# Patient Record
Sex: Female | Born: 2000 | Race: Black or African American | Hispanic: No | Marital: Single | Smoking: Never smoker
Health system: Southern US, Community
[De-identification: ages and names within clinical notes are randomized; demographics above are authoritative.]

## PROBLEM LIST (undated history)

## (undated) DIAGNOSIS — Z862 Personal history of diseases of the blood and blood-forming organs and certain disorders involving the immune mechanism: Secondary | ICD-10-CM

## (undated) DIAGNOSIS — D6800 Von Willebrand disease, unspecified: Secondary | ICD-10-CM

## (undated) DIAGNOSIS — Z3009 Encounter for other general counseling and advice on contraception: Secondary | ICD-10-CM

## (undated) DIAGNOSIS — K649 Unspecified hemorrhoids: Secondary | ICD-10-CM

## (undated) DIAGNOSIS — D649 Anemia, unspecified: Secondary | ICD-10-CM

## (undated) DIAGNOSIS — Z8719 Personal history of other diseases of the digestive system: Secondary | ICD-10-CM

## (undated) HISTORY — DX: Personal history of other diseases of the digestive system: Z87.19

## (undated) HISTORY — DX: Encounter for other general counseling and advice on contraception: Z30.09

## (undated) HISTORY — DX: Von Willebrand disease, unspecified: D68.00

## (undated) HISTORY — DX: Unspecified hemorrhoids: K64.9

## (undated) HISTORY — DX: Anemia, unspecified: D64.9

## (undated) HISTORY — DX: Von Willebrand's disease: D68.0

## (undated) HISTORY — DX: Personal history of diseases of the blood and blood-forming organs and certain disorders involving the immune mechanism: Z86.2

---
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
## 2019-01-26 ENCOUNTER — Ambulatory Visit (HOSPITAL_COMMUNITY): Payer: Self-pay | Admitting: Psychiatry

## 2019-01-26 ENCOUNTER — Ambulatory Visit (HOSPITAL_COMMUNITY): Payer: Medicaid Other | Admitting: Psychiatry

## 2019-01-26 ENCOUNTER — Telehealth (HOSPITAL_COMMUNITY): Payer: Self-pay | Admitting: Psychiatry

## 2019-01-26 ENCOUNTER — Other Ambulatory Visit: Payer: Self-pay

## 2019-01-26 NOTE — Telephone Encounter (Signed)
Therapist attempted to contact patient twice via text through doxy [email or web address] platform, no response.  Therapist then tried to contact patient via mobile phone and was informed patient was not available.

## 2019-02-11 ENCOUNTER — Encounter: Payer: Self-pay | Admitting: Adult Health

## 2019-02-11 ENCOUNTER — Other Ambulatory Visit: Payer: Self-pay

## 2019-02-11 ENCOUNTER — Ambulatory Visit (INDEPENDENT_AMBULATORY_CARE_PROVIDER_SITE_OTHER): Payer: Medicaid Other | Admitting: Adult Health

## 2019-02-11 VITALS — BP 114/72 | HR 95 | Ht 62.0 in | Wt 191.0 lb

## 2019-02-11 DIAGNOSIS — N926 Irregular menstruation, unspecified: Secondary | ICD-10-CM

## 2019-02-11 DIAGNOSIS — Z3202 Encounter for pregnancy test, result negative: Secondary | ICD-10-CM | POA: Diagnosis not present

## 2019-02-11 LAB — POCT URINE PREGNANCY: Preg Test, Ur: NEGATIVE

## 2019-02-11 NOTE — Progress Notes (Signed)
  Subjective:     Patient ID: Cynthia Acosta, female   DOB: 2000-05-13, 18 y.o.   MRN: [id number]  HPI Cynthia Acosta is a 18 year old black female,single, G0P0, in to discuss birth control options. She got depo in June and bleeding the whole time, does not want that any more or IUD or the pill. PCP is [facility].  Review of Systems +irregular  bleeding  Last depo in June, bleeding all the time on depo Had unprotected sex 01/31/2019 Has had nipple tenderness and fatigue and mood swings with some cramping Reviewed past medical,surgical, social and family history. Reviewed medications and allergies.     Objective:   Physical Exam BP 114/72 (BP Location: Left Arm, Patient Position: Sitting, Cuff Size: Normal)   Pulse 95   Ht 5\' 2"  (1.575 m)   Wt 191 lb (86.6 kg)   LMP 01/24/2019 (Exact Date)   BMI 34.93 kg/m UPT is negative, Skin warm and dry. Neck: mid line trachea, normal thyroid, good ROM, no lymphadenopathy noted. Lungs: clear to ausculation bilaterally. Cardiovascular: regular rate and rhythm. Fall risk is low PHQ 2 score 0.    Exam by Weyman Croon FNP student,under my supervision. Discussed the ring or the patch, or just use condoms every time, also discussed Plan B usage.   Assessment:     1. Irregular bleeding   2. Urine pregnancy test negative       Plan:     Check [facility] today, will talk tomorrow  Follow up prn Use condoms

## 2019-02-12 LAB — BETA HCG QUANT (REF LAB): hCG Quant: <1 m[IU]/mL

## 2019-03-31 ENCOUNTER — Ambulatory Visit: Payer: Medicaid Other | Admitting: Adult Health

## 2019-05-11 ENCOUNTER — Other Ambulatory Visit: Payer: Medicaid Other

## 2019-06-02 ENCOUNTER — Other Ambulatory Visit (INDEPENDENT_AMBULATORY_CARE_PROVIDER_SITE_OTHER): Payer: Medicaid Other | Admitting: *Deleted

## 2019-06-02 ENCOUNTER — Other Ambulatory Visit: Payer: Self-pay

## 2019-06-02 DIAGNOSIS — R8290 Unspecified abnormal findings in urine: Secondary | ICD-10-CM | POA: Diagnosis not present

## 2019-06-02 LAB — POCT URINALYSIS DIPSTICK
Blood, UA: NEGATIVE
Glucose, UA: NEGATIVE
Ketones, UA: NEGATIVE
Leukocytes, UA: NEGATIVE
Nitrite, UA: NEGATIVE
Protein, UA: POSITIVE — AB

## 2019-06-02 NOTE — Progress Notes (Signed)
Chart reviewed for nurse visit. Agree with plan of care.  Adline Potter, NP 06/02/2019 12:46 PM

## 2019-06-02 NOTE — Progress Notes (Signed)
   NURSE VISIT- UTI SYMPTOMS   SUBJECTIVE:  Cynthia Acosta is a 19 y.o. G0P0000 female here for UTI symptoms and STD screen. States she went to Urgent Care on Saturday for the same symptoms.  Nuswab and urinalysis was done but she sas treated with Flagyl.  All tests were negative. She still reports cloudy urine and odor.  OBJECTIVE:  There were no vitals taken for this visit.  Appears well, in no apparent distress  Results for orders placed or performed in visit on 06/02/19 (from the past 24 hour(s))  POCT Urinalysis Dipstick   Collection Time: 06/02/19 10:23 AM  Result Value Ref Range   Color, UA     Clarity, UA     Glucose, UA Negative Negative   Bilirubin, UA     Ketones, UA neg    Spec Grav, UA     Blood, UA neg    pH, UA     Protein, UA Positive (A) Negative   Urobilinogen, UA     Nitrite, UA neg    Leukocytes, UA Negative Negative   Appearance     Odor      ASSESSMENT: GYN patient with UTI symptoms and negative nitrites  PLAN: Discussed with Cyril Mourning, AGNP   Rx sent by provider today: No Urine culture sent Call or return to clinic prn if these symptoms worsen or fail to improve as anticipated. Follow-up: as needed   Jobe Marker  06/02/2019 10:28 AM

## 2019-06-03 ENCOUNTER — Telehealth: Payer: Self-pay | Admitting: *Deleted

## 2019-06-03 NOTE — Telephone Encounter (Signed)
Pt requesting visit for pelvic exam. She would like to be examined. She feels like something is not right. Reports swelling. Pt just started  New job so I told her to call back and let us know when she could make appt.

## 2019-06-05 LAB — URINE CULTURE

## 2019-06-05 LAB — SPECIMEN STATUS REPORT

## 2019-06-08 ENCOUNTER — Telehealth: Payer: Self-pay | Admitting: Adult Health

## 2019-06-08 MED ORDER — CIPROFLOXACIN HCL 500 MG PO TABS: 500.0000 mg | ORAL_TABLET | Freq: Two times a day (BID) | ORAL | 0 refills | Status: DC

## 2019-06-08 NOTE — Telephone Encounter (Signed)
Urine culture was ++. will rx cipro

## 2019-06-12 ENCOUNTER — Other Ambulatory Visit: Payer: Self-pay

## 2019-06-12 ENCOUNTER — Ambulatory Visit: Payer: Medicaid Other | Attending: Internal Medicine

## 2019-06-12 DIAGNOSIS — Z20822 Contact with and (suspected) exposure to covid-19: Secondary | ICD-10-CM

## 2019-06-13 LAB — NOVEL CORONAVIRUS, NAA: SARS-CoV-2, NAA: NOT DETECTED

## 2019-11-05 ENCOUNTER — Encounter: Payer: Self-pay | Admitting: Adult Health

## 2019-11-05 ENCOUNTER — Other Ambulatory Visit (HOSPITAL_COMMUNITY)
Admission: RE | Admit: 2019-11-05 | Discharge: 2019-11-05 | Disposition: A | Discharge status: Home / Self Care | Payer: Medicaid Other | Source: Ambulatory Visit | Attending: Adult Health | Admitting: Adult Health

## 2019-11-05 ENCOUNTER — Ambulatory Visit (INDEPENDENT_AMBULATORY_CARE_PROVIDER_SITE_OTHER): Payer: Medicaid Other | Admitting: Adult Health

## 2019-11-05 VITALS — BP 128/70 | HR 80 | Ht 62.0 in | Wt 185.0 lb

## 2019-11-05 DIAGNOSIS — N898 Other specified noninflammatory disorders of vagina: Secondary | ICD-10-CM | POA: Diagnosis not present

## 2019-11-05 DIAGNOSIS — Z113 Encounter for screening for infections with a predominantly sexual mode of transmission: Secondary | ICD-10-CM | POA: Diagnosis not present

## 2019-11-05 MED ORDER — METRONIDAZOLE 500 MG PO TABS: 500.0000 mg | ORAL_TABLET | Freq: Two times a day (BID) | ORAL | 0 refills | Status: DC

## 2019-11-05 NOTE — Progress Notes (Signed)
  Subjective:     Patient ID: Cynthia Acosta, female   DOB: [birth year], 19 y.o.   MRN: [id number]  HPI Cynthia Acosta is a 19 year old black female, single, G0P0, in complaining of vaginal discharge and odor. PCP is [facility]   Review of Systems +vaginal discharge with odor Has new partner Reviewed past medical,surgical, social and family history. Reviewed medications and allergies.     Objective:   Physical Exam BP 128/70 (BP Location: Right Arm, Patient Position: Sitting)   Pulse 80   Ht 5\' 2"  (1.575 m)   Wt 185 lb (83.9 kg)   LMP 10/24/2019   BMI 33.84 kg/m  Skin warm and dry.Pelvic: external genitalia is normal in appearance no lesions, vagina: white discharge with odor,urethra has no lesions or masses noted, cervix:nulliparous,uterus: normal size, shape and contour, non tender, no masses felt, adnexa: no masses or tenderness noted. Bladder is non tender and no masses felt. CV swab obtained.   fall risk is low PHQ 2 score is 0  [facility] - 11/05/19 1143      Pregnancy Intention Screening   Does the patient want to become pregnant in the next year? Ok Either Way    Does the patient's partner want to become pregnant in the next year? Ok Either Way    Would the patient like to discuss contraceptive options today? No      Contraception Wrap Up   Current Method No Contraceptive Precautions    End Method No Contraception Precautions    Contraception Counseling Provided No         Examination chaperoned by 11/07/19 LPN  Assessment:     1. Vaginal discharge CV swab sent   2. Vaginal odor Will Rx flagyl Meds ordered this encounter  Medications  . metroNIDAZOLE (FLAGYL) 500 MG tablet    Sig: Take 1 tablet (500 mg total) by mouth 2 (two) times daily.    Dispense:  14 tablet    Refill:  0    Order Specific Question:   Supervising Provider    Answer:   Malachy Mood, LUTHER H [[id number]]    3. Screening examination for STD (sexually transmitted disease) CV swab sent    She declines HIV and  RPR Plan:     Take PNV or folic acid Return in 10 months for pap and physical

## 2019-11-06 LAB — CERVICOVAGINAL ANCILLARY ONLY
Bacterial Vaginitis (gardnerella): POSITIVE — AB
Candida Glabrata: NEGATIVE
Candida Vaginitis: NEGATIVE
Chlamydia: NEGATIVE
Comment: NEGATIVE
Comment: NEGATIVE
Comment: NEGATIVE
Comment: NEGATIVE
Comment: NEGATIVE
Comment: NORMAL
Neisseria Gonorrhea: NEGATIVE
Trichomonas: NEGATIVE

## 2019-11-11 ENCOUNTER — Ambulatory Visit
Admission: EM | Admit: 2019-11-11 | Discharge: 2019-11-11 | Disposition: A | Discharge status: Home / Self Care | Payer: Medicaid Other | Attending: Emergency Medicine | Admitting: Emergency Medicine

## 2019-11-11 ENCOUNTER — Other Ambulatory Visit: Payer: Self-pay

## 2019-11-11 DIAGNOSIS — R110 Nausea: Secondary | ICD-10-CM | POA: Diagnosis not present

## 2019-11-11 DIAGNOSIS — R109 Unspecified abdominal pain: Secondary | ICD-10-CM | POA: Diagnosis not present

## 2019-11-11 LAB — POCT URINALYSIS DIP (MANUAL ENTRY)
Bilirubin, UA: NEGATIVE
Blood, UA: NEGATIVE
Glucose, UA: NEGATIVE mg/dL
Ketones, POC UA: NEGATIVE mg/dL
Nitrite, UA: NEGATIVE
Protein Ur, POC: NEGATIVE mg/dL
Spec Grav, UA: 1.025 (ref 1.010–1.025)
Urobilinogen, UA: 0.2 E.U./dL
pH, UA: 7 (ref 5.0–8.0)

## 2019-11-11 LAB — POCT URINE PREGNANCY: Preg Test, Ur: NEGATIVE

## 2019-11-11 MED ORDER — ONDANSETRON HCL 4 MG PO TABS: 4.0000 mg | ORAL_TABLET | Freq: Three times a day (TID) | ORAL | 0 refills | Status: DC | PRN

## 2019-11-11 NOTE — Discharge Instructions (Addendum)
POCT pregnancy test is negative POCT urine analysis show small amount of leukocyte/urine will be sent for culture and someone will call if your result is abnormal Get rest and drink fluids Zofran prescribed.  Take as directed.    DIET Instructions:  30 minutes after taking nausea medicine, begin with sips of clear liquids. If able to hold down 2 - 4 ounces for 30 minutes, begin drinking more. Increase your fluid intake to replace losses. Clear liquids only for 24 hours (water, tea, sport drinks, clear flat ginger ale or cola and juices, broth, jello, popsicles, ect). Advance to bland foods, applesauce, rice, baked or boiled chicken, ect. Avoid milk, greasy foods and anything that doesn't agree with you.  If you experience new or worsening symptoms return or go to ER such as fever, chills, nausea, vomiting, diarrhea, bloody or dark tarry stools, constipation, urinary symptoms, worsening abdominal discomfort, symptoms that do not improve with medications, inability to keep fluids down, etc..Marland Kitchen

## 2019-11-11 NOTE — ED Triage Notes (Signed)
Pt presents with c/o abdominal cramping with nausea and weakness, denies vomiting , began to day

## 2019-11-11 NOTE — ED Provider Notes (Signed)
[facility]   [id number] 11/11/19 Arrival Time: 1915  Chief Complaint  Patient presents with  . Nausea  . Abdominal Cramping     SUBJECTIVE:  Cynthia Acosta is a 19 y.o. female who presented to the urgent care for complaint of abdominal cramping, nausea and weakness started today.  Denies a precipitating event, trauma, close contacts with similar symptoms, recent travel or antibiotic use.  Localizes cramping to generalized abdomen.  Describes as intermittent.  Has not tried any OTC medication.  Denies alleviating or aggravating factors.  Reports/ denies similar symptoms in the past.  Last BM  07/28/2019.   Denies chills, fever, vomiting, chest pain, diarrhea.     Patient's last menstrual period was 10/24/2019.  ROS: As per HPI.  All other pertinent ROS negative.     Past Medical History:  Diagnosis Date  . Anemia   . Contraceptive education 11/05/2013  . Hemorrhoid 11/05/2013  . History of rectal bleeding 11/05/2013  . History of von Willebrand's disease 11/05/2013  . Von Willebrand disease (HCC)    History reviewed. No pertinent surgical history. No Known Allergies No current facility-administered medications on file prior to encounter.   Current Outpatient Medications on File Prior to Encounter  Medication Sig Dispense Refill  . metroNIDAZOLE (FLAGYL) 500 MG tablet Take 1 tablet (500 mg total) by mouth 2 (two) times daily. 14 tablet 0   Social History   Socioeconomic History  . Marital status: Single    Spouse name: Not on file  . Number of children: Not on file  . Years of education: Not on file  . Highest education level: Not on file  Occupational History  . Not on file  Tobacco Use  . Smoking status: Never Smoker  . Smokeless tobacco: Never Used  Vaping Use  . Vaping Use: Never used  Substance and Sexual Activity  . Alcohol use: No  . Drug use: No  . Sexual activity: Yes    Birth control/protection: None  Other Topics Concern  . Not on file    Social History Narrative  . Not on file   Social Determinants of Health   Financial Resource Strain:   . Difficulty of Paying Living Expenses:   Food Insecurity:   . Worried About Programme researcher, broadcasting/film/video in the Last Year:   . Barista in the Last Year:   Transportation Needs:   . Freight forwarder (Medical):   Marland Kitchen Lack of Transportation (Non-Medical):   Physical Activity:   . Days of Exercise per Week:   . Minutes of Exercise per Session:   Stress:   . Feeling of Stress :   Social Connections:   . Frequency of Communication with Friends and Family:   . Frequency of Social Gatherings with Friends and Family:   . Attends Religious Services:   . Active Member of Clubs or Organizations:   . Attends Banker Meetings:   Marland Kitchen Marital Status:   Intimate Partner Violence:   . Fear of Current or Ex-Partner:   . Emotionally Abused:   Marland Kitchen Physically Abused:   . Sexually Abused:    Family History  Problem Relation Age of Onset  . Anemia Mother   . Autism Brother      OBJECTIVE:  Vitals:   11/11/19 1941  BP: 111/78  Pulse: 74  Resp: 20  Temp: 98.6 F (37 C)  SpO2: 98%    General appearance: Alert; NAD HEENT: NCAT.  Oropharynx clear.  Lungs: clear to auscultation bilaterally without adventitious breath sounds Heart: regular rate and rhythm.  Radial pulses 2+ symmetrical bilaterally Abdomen: soft, non-distended; normal active bowel sounds; non-tender to light and deep palpation; nontender at McBurney's point; negative Murphy's sign; negative rebound; no guarding Back: no CVA tenderness Extremities: no edema; symmetrical with no gross deformities Skin: warm and dry Neurologic: normal gait Psychological: alert and cooperative; normal mood and affect  LABS: Results for orders placed or performed during the hospital encounter of 11/11/19 (from the past 24 hour(s))  POCT urinalysis dipstick     Status: Abnormal   Collection Time: 11/11/19  7:54 PM  Result  Value Ref Range   Color, UA yellow yellow   Clarity, UA clear clear   Glucose, UA negative negative mg/dL   Bilirubin, UA negative negative   Ketones, POC UA negative negative mg/dL   Spec Grav, UA 5.701 7.793 - 1.025   Blood, UA negative negative   pH, UA 7.0 5.0 - 8.0   Protein Ur, POC negative negative mg/dL   Urobilinogen, UA 0.2 0.2 or 1.0 E.U./dL   Nitrite, UA Negative Negative   Leukocytes, UA Small (1+) (A) Negative  POCT urine pregnancy     Status: None   Collection Time: 11/11/19  8:19 PM  Result Value Ref Range   Preg Test, Ur Negative Negative    DIAGNOSTIC STUDIES: No results found.   ASSESSMENT & PLAN:  1. Nausea without vomiting   2. Abdominal cramping     Meds ordered this encounter  Medications  . ondansetron (ZOFRAN) 4 MG tablet    Sig: Take 1 tablet (4 mg total) by mouth every 8 (eight) hours as needed for nausea or vomiting.    Dispense:  20 tablet    Refill:  0   Discharge instruction  POCT pregnancy test is negative POCT urine analysis show small amount of leukocyte/urine will be sent for culture and someone will call if your result is abnormal Get rest and drink fluids Zofran prescribed.  Take as directed.    DIET Instructions:  30 minutes after taking nausea medicine, begin with sips of clear liquids. If able to hold down 2 - 4 ounces for 30 minutes, begin drinking more. Increase your fluid intake to replace losses. Clear liquids only for 24 hours (water, tea, sport drinks, clear flat ginger ale or cola and juices, broth, jello, popsicles, ect). Advance to bland foods, applesauce, rice, baked or boiled chicken, ect. Avoid milk, greasy foods and anything that doesn't agree with you.  If you experience new or worsening symptoms return or go to ER such as fever, chills, nausea, vomiting, diarrhea, bloody or dark tarry stools, constipation, urinary symptoms, worsening abdominal discomfort, symptoms that do not improve with medications, inability to  keep fluids down, etc...  Reviewed expectations re: course of current medical issues. Questions answered. Outlined signs and symptoms indicating need for more acute intervention. Patient verbalized understanding. After Visit Summary given.   Durward Parcel, FNP 11/11/19 2023

## 2019-11-13 LAB — URINE CULTURE: Culture: <10000 — AB

## 2019-12-22 ENCOUNTER — Encounter: Payer: Self-pay | Admitting: Adult Health

## 2019-12-22 ENCOUNTER — Other Ambulatory Visit (HOSPITAL_COMMUNITY)
Admission: RE | Admit: 2019-12-22 | Discharge: 2019-12-22 | Disposition: A | Discharge status: Home / Self Care | Payer: Medicaid Other | Source: Ambulatory Visit | Attending: Adult Health | Admitting: Adult Health

## 2019-12-22 ENCOUNTER — Ambulatory Visit (INDEPENDENT_AMBULATORY_CARE_PROVIDER_SITE_OTHER): Payer: Medicaid Other | Admitting: Adult Health

## 2019-12-22 VITALS — BP 108/68 | HR 65 | Ht 62.0 in | Wt 183.2 lb

## 2019-12-22 DIAGNOSIS — N920 Excessive and frequent menstruation with regular cycle: Secondary | ICD-10-CM | POA: Diagnosis not present

## 2019-12-22 DIAGNOSIS — Z113 Encounter for screening for infections with a predominantly sexual mode of transmission: Secondary | ICD-10-CM

## 2019-12-22 LAB — POCT URINE PREGNANCY: Preg Test, Ur: NEGATIVE

## 2019-12-22 NOTE — Progress Notes (Signed)
  Subjective:     Patient ID: Cynthia Acosta, female   DOB: 12/15/2000, 19 y.o.   MRN: [id number]  HPI Cynthia Acosta is a 19 year old black female,single G0P0 in complaining of early and  heavy period this time, is not on any birth control. Has Von Willebrand Disease by history.  PCP is [facility].   Review of Systems This period a week early Started pink Friday, then red Saturday and heavy Sunday changed pads every hour stopped Monday and back red today Reviewed past medical,surgical, social and family history. Reviewed medications and allergies.      Objective:   Physical Exam BP 108/68 (BP Location: Left Arm, Patient Position: Sitting, Cuff Size: Normal)   Pulse 65   Ht 5\' 2"  (1.575 m)   Wt 183 lb 3.2 oz (83.1 kg)   LMP 12/19/2019 (Exact Date)   BMI 33.51 kg/m UPT is negative Skin warm and dry.Pelvic: external genitalia is normal in appearance no lesions, vagina: period blood.,urethra has no lesions or masses noted, cervix:smooth, uterus: normal size, shape and contour, non tender, no masses felt, adnexa: no masses or tenderness noted. Bladder is non tender and no masses felt. CV swab obtained.   Examination chaperoned by 02/18/2020 LPN  [facility] - 12/22/19 1515      Pregnancy Intention Screening   Does the patient want to become pregnant in the next year? No    Does the patient's partner want to become pregnant in the next year? No    Would the patient like to discuss contraceptive options today? No      Contraception Wrap Up   Current Method No Contraceptive Precautions    End Method No Contraception Precautions    Contraception Counseling Provided No           Assessment:     1. Menorrhagia with regular cycle Check CBC,CMP,TSH  She does not want to take OCs   2. Screening examination for STD (sexually transmitted disease) CV swab sent     Plan:    Follow up prn  Take folic acid 800 mcg daily or PNV

## 2019-12-23 LAB — CBC
Hematocrit: 27 % — ABNORMAL LOW (ref 34.0–46.6)
Hemoglobin: 7.7 g/dL — ABNORMAL LOW (ref 11.1–15.9)
MCH: 19.7 pg — ABNORMAL LOW (ref 26.6–33.0)
MCHC: 28.5 g/dL — ABNORMAL LOW (ref 31.5–35.7)
MCV: 69 fL — ABNORMAL LOW (ref 79–97)
Platelets: 367 10*3/uL (ref 150–450)
RBC: 3.9 x10E6/uL (ref 3.77–5.28)
RDW: 19.4 % — ABNORMAL HIGH (ref 11.7–15.4)
WBC: 3.9 10*3/uL (ref 3.4–10.8)

## 2019-12-23 LAB — COMPREHENSIVE METABOLIC PANEL
ALT: 5 IU/L (ref 0–32)
AST: 14 IU/L (ref 0–40)
Albumin/Globulin Ratio: 1.5 (ref 1.2–2.2)
Albumin: 4.4 g/dL (ref 3.9–5.0)
Alkaline Phosphatase: 49 IU/L (ref 42–106)
BUN/Creatinine Ratio: 8 — ABNORMAL LOW (ref 9–23)
BUN: 7 mg/dL (ref 6–20)
Bilirubin Total: <0.2 mg/dL (ref 0.0–1.2)
CO2: 23 mmol/L (ref 20–29)
Calcium: 8.9 mg/dL (ref 8.7–10.2)
Chloride: 105 mmol/L (ref 96–106)
Creatinine, Ser: 0.93 mg/dL (ref 0.57–1.00)
GFR calc Af Amer: 102 mL/min/{1.73_m2} (ref >59)
GFR calc non Af Amer: 89 mL/min/{1.73_m2} (ref >59)
Globulin, Total: 2.9 g/dL (ref 1.5–4.5)
Glucose: 90 mg/dL (ref 65–99)
Potassium: 4.5 mmol/L (ref 3.5–5.2)
Sodium: 139 mmol/L (ref 134–144)
Total Protein: 7.3 g/dL (ref 6.0–8.5)

## 2019-12-23 LAB — TSH: TSH: 1.11 u[IU]/mL (ref 0.450–4.500)

## 2019-12-24 LAB — CERVICOVAGINAL ANCILLARY ONLY
Chlamydia: NEGATIVE
Comment: NEGATIVE
Comment: NEGATIVE
Comment: NORMAL
Neisseria Gonorrhea: NEGATIVE
Trichomonas: NEGATIVE

## 2019-12-25 ENCOUNTER — Encounter: Payer: Self-pay | Admitting: Adult Health

## 2019-12-25 ENCOUNTER — Telehealth: Payer: Self-pay | Admitting: Adult Health

## 2019-12-25 DIAGNOSIS — D509 Iron deficiency anemia, unspecified: Secondary | ICD-10-CM

## 2019-12-25 HISTORY — DX: Iron deficiency anemia, unspecified: D50.9

## 2019-12-25 NOTE — Telephone Encounter (Signed)
Pt aware GC/CHL and trich were negative on vaginal swab, and that HGB 7.7 get back on iron and follow up in about 29-months

## 2020-02-26 IMAGING — CT CT ANGIOGRAPHY NECK
1 of 10 series · 6 of 33 positions shown · IV contrast (Isovue)
Comparison: Head CT 11/23/2010

CLINICAL DATA: Sudden onset headache. Weakness. History of Von
Willebrand's disease.

EXAM:
CT ANGIOGRAPHY HEAD AND NECK
TECHNIQUE: Multidetector CT imaging of the head and neck was performed using
the standard protocol during bolus administration of intravenous
contrast. Multiplanar CT image reconstructions and MIPs were
obtained to evaluate the vascular anatomy. Carotid stenosis
measurements (when applicable) are obtained utilizing NASCET
criteria, using the distal internal carotid diameter as the
denominator.
CONTRAST:  75mL OMNIPAQUE IOHEXOL 350 MG/ML SOLN

[Series 10: ax thin · axial · 0.39mm/px · z∈[-124,+109]mm · 6 of 327 slices shown]
[im 47/327  soft-tissue]
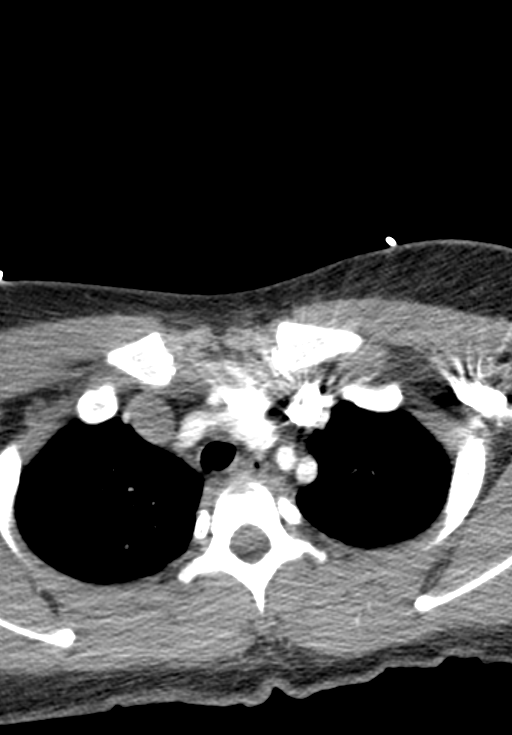
[im 94/327  bone]
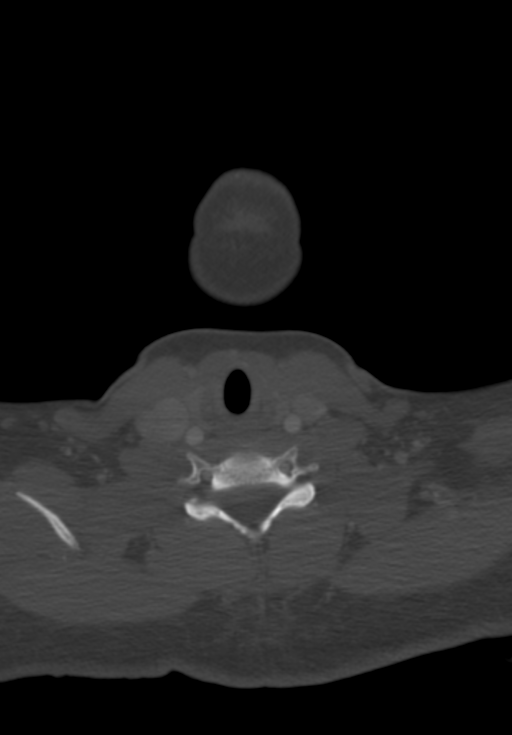
[im 140/327  soft-tissue]
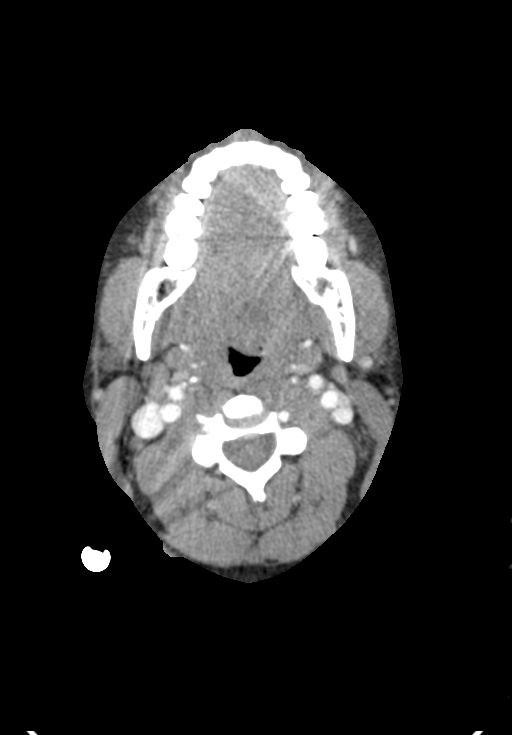
[im 187/327  bone]
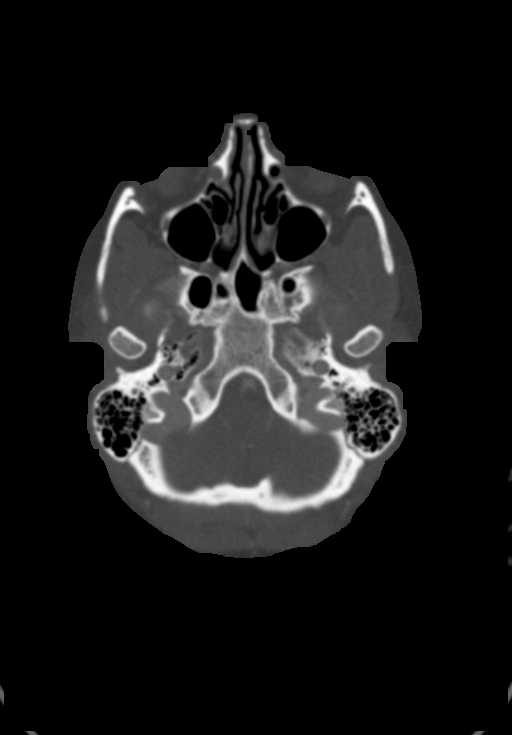
[im 233/327  soft-tissue]
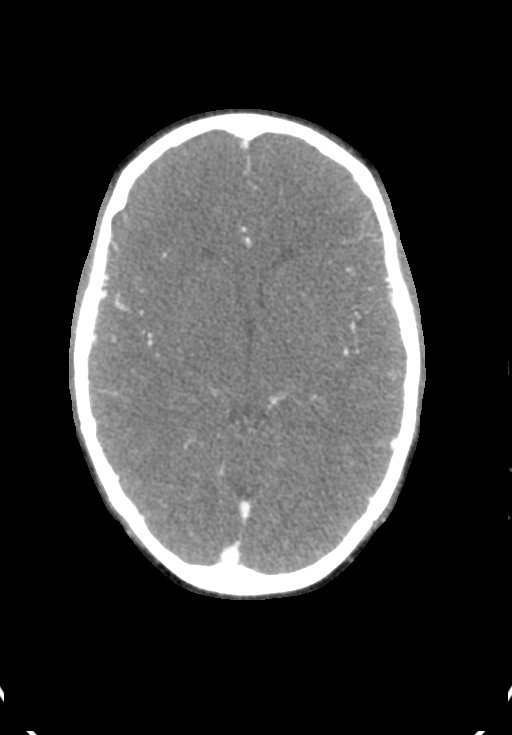
[im 280/327  bone]
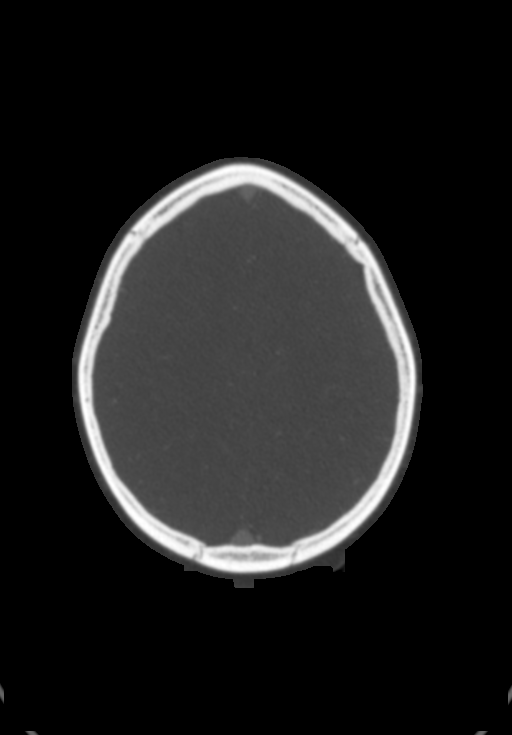

[6 of 33 positions shown; findings below may reference images not displayed]

FINDINGS: CT HEAD FINDINGS

Brain: There is no evidence of acute infarct, intracranial
hemorrhage, mass, midline shift, or extra-axial fluid collection.
The ventricles and sulci are normal.

Vascular: No hyperdense vessel.

Skull: No fracture or focal osseous lesion.

Sinuses: Paranasal sinuses and mastoid air cells are clear.

Orbits: Unremarkable.

Review of the MIP images confirms the above findings

CTA NECK FINDINGS

Aortic arch: Standard 3 vessel aortic arch with widely patent arch
vessel origins.

Right carotid system: Patent and smooth without evidence of stenosis
or dissection.

Left carotid system: Patent and smooth without evidence of stenosis
or dissection.

Vertebral arteries: Patent and smooth without evidence of stenosis
or dissection. Codominant.

Skeleton: No acute osseous abnormality or suspicious osseous lesion.

Other neck: No mass or enlarged lymph nodes.

Upper chest: Clear lung apices.

Review of the MIP images confirms the above findings

CTA HEAD FINDINGS

Anterior circulation: The internal carotid arteries are widely
patent from skull base to carotid termini. ACAs and MCAs are patent
without evidence of proximal branch occlusion or significant
proximal stenosis. No aneurysm is identified.

Posterior circulation: The intracranial vertebral arteries are
widely patent to the basilar. The basilar artery is widely patent
with a small fenestration incidentally noted proximally. Cerebellar
arteries are grossly unremarkable. Posterior communicating arteries
are not identified and may be diminutive or absent. PCAs are patent
without evidence of significant proximal stenosis. No aneurysm is
identified.

Venous sinuses: Patent.

Anatomic variants: None.

Review of the MIP images confirms the above findings
IMPRESSION: Negative head and neck CTA. Unremarkable CT appearance of the brain.

## 2020-04-09 ENCOUNTER — Encounter (HOSPITAL_COMMUNITY): Payer: Self-pay | Admitting: Emergency Medicine

## 2020-04-09 ENCOUNTER — Emergency Department (HOSPITAL_COMMUNITY)
Admission: EM | Admit: 2020-04-09 | Discharge: 2020-04-09 | Disposition: A | Discharge status: Home / Self Care | Payer: Medicaid Other | Attending: Emergency Medicine | Admitting: Emergency Medicine

## 2020-04-09 ENCOUNTER — Other Ambulatory Visit: Payer: Self-pay

## 2020-04-09 DIAGNOSIS — R519 Headache, unspecified: Secondary | ICD-10-CM | POA: Diagnosis present

## 2020-04-09 DIAGNOSIS — U071 COVID-19: Secondary | ICD-10-CM

## 2020-04-09 DIAGNOSIS — G4489 Other headache syndrome: Secondary | ICD-10-CM

## 2020-04-09 LAB — CBC WITH DIFFERENTIAL/PLATELET
Abs Immature Granulocytes: 0.01 10*3/uL (ref 0.00–0.07)
Basophils Absolute: 0 10*3/uL (ref 0.0–0.1)
Basophils Relative: 1 %
Eosinophils Absolute: 0 10*3/uL (ref 0.0–0.5)
Eosinophils Relative: 0 %
HCT: 28.4 % — ABNORMAL LOW (ref 36.0–46.0)
Hemoglobin: 8.3 g/dL — ABNORMAL LOW (ref 12.0–15.0)
Immature Granulocytes: 0 %
Lymphocytes Relative: 36 %
Lymphs Abs: 1.5 10*3/uL (ref 0.7–4.0)
MCH: 20.8 pg — ABNORMAL LOW (ref 26.0–34.0)
MCHC: 29.2 g/dL — ABNORMAL LOW (ref 30.0–36.0)
MCV: 71 fL — ABNORMAL LOW (ref 80.0–100.0)
Monocytes Absolute: 0.3 10*3/uL (ref 0.1–1.0)
Monocytes Relative: 8 %
Neutro Abs: 2.3 10*3/uL (ref 1.7–7.7)
Neutrophils Relative %: 55 %
Platelets: 264 10*3/uL (ref 150–400)
RBC: 4 MIL/uL (ref 3.87–5.11)
RDW: 18.9 % — ABNORMAL HIGH (ref 11.5–15.5)
WBC: 4.2 10*3/uL (ref 4.0–10.5)
nRBC: 0 % (ref 0.0–0.2)

## 2020-04-09 LAB — RESP PANEL BY RT-PCR (FLU A&B, COVID) ARPGX2
Influenza A by PCR: NEGATIVE
Influenza B by PCR: NEGATIVE
SARS Coronavirus 2 by RT PCR: POSITIVE — AB

## 2020-04-09 LAB — BASIC METABOLIC PANEL
Anion gap: 8 (ref 5–15)
BUN: 11 mg/dL (ref 6–20)
CO2: 24 mmol/L (ref 22–32)
Calcium: 8.8 mg/dL — ABNORMAL LOW (ref 8.9–10.3)
Chloride: 104 mmol/L (ref 98–111)
Creatinine, Ser: 0.84 mg/dL (ref 0.44–1.00)
GFR, Estimated: >60 mL/min (ref >60)
Glucose, Bld: 102 mg/dL — ABNORMAL HIGH (ref 70–99)
Potassium: 4.2 mmol/L (ref 3.5–5.1)
Sodium: 136 mmol/L (ref 135–145)

## 2020-04-09 LAB — I-STAT BETA HCG BLOOD, ED (MC, WL, AP ONLY): I-stat hCG, quantitative: <5 m[IU]/mL (ref <5)

## 2020-04-09 MED ORDER — METOCLOPRAMIDE HCL 5 MG/ML IJ SOLN
10.0000 mg | Freq: Once | INTRAMUSCULAR | Status: AC
  Administered 2020-04-09: 10 mg via INTRAVENOUS
  Filled 2020-04-09: qty 2

## 2020-04-09 MED ORDER — DIPHENHYDRAMINE HCL 50 MG/ML IJ SOLN
25.0000 mg | Freq: Once | INTRAMUSCULAR | Status: AC
  Administered 2020-04-09: 25 mg via INTRAVENOUS
  Filled 2020-04-09: qty 1

## 2020-04-09 NOTE — ED Notes (Signed)
ED Provider at bedside. 

## 2020-04-09 NOTE — ED Triage Notes (Signed)
Pt c/o headache since Thursday. States tonight it began to hurt behind her eyes. Pt states she was exposed to COVID last week but tested negative on Wednesday.

## 2020-04-09 NOTE — ED Notes (Signed)
Date and time results received: 04/09/20 0410   Test: COVID Critical Value: POSITIVE  Name of Provider Notified: Bebe Shaggy, MD

## 2020-04-09 NOTE — ED Provider Notes (Signed)
[facility] EMERGENCY DEPARTMENT Provider Note   CSN: [id number] Arrival date & time: 04/09/20  0128     History Chief Complaint  Patient presents with  . Headache    MALONIE TATUM is a 20 y.o. female.  The history is provided by the patient.  Headache Pain location:  Generalized Quality:  Dull Onset quality:  Gradual Duration:  2 days Timing:  Intermittent Progression:  Worsening Chronicity:  New Similar to prior headaches: yes   Relieved by:  Nothing Worsened by:  Nothing Associated symptoms: cough, nausea and vomiting   Associated symptoms: no blurred vision, no fever, no focal weakness, no visual change and no weakness   Patient with history of anemia, von Willebrand disease, iron deficiency anemia presents with headache.  She reports over the past 2 days she has had gradually worsening headache.  She also reports associated cough.  She has had an episode of vomiting. No focal weakness.  She does report the headache is around her eyes but denies any visual changes.  No falls or head injury. Patient reports frequent history of anemia due to heavy vaginal bleeding.  She suspects her blood counts may be low.  She also reports recent Covid exposure     Past Medical History:  Diagnosis Date  . Anemia   . Contraceptive education 11/05/2013  . Hemorrhoid 11/05/2013  . History of rectal bleeding 11/05/2013  . History of von Willebrand's disease 11/05/2013  . Iron deficiency anemia, unspecified 12/25/2019  . Von Willebrand disease [facility])     Patient Active Problem List   Diagnosis Date Noted  . Iron deficiency anemia, unspecified 12/25/2019  . Menorrhagia with regular cycle 12/22/2019  . Screening examination for STD (sexually transmitted disease) 12/22/2019  . Irregular bleeding 02/11/2019  . Urine pregnancy test negative 02/11/2019  . Hemorrhoid 11/05/2013  . History of rectal bleeding 11/05/2013  . Contraceptive education 11/05/2013  . History of von Willebrand's  disease 11/05/2013  . Urinary incontinence 03/12/2013  . Urinary urgency 03/12/2013  . Urinary frequency 03/12/2013  . Fullness of neck 03/12/2013    History reviewed. No pertinent surgical history.   OB History    Gravida  0   Para  0   Term  0   Preterm  0   AB  0   Living  0     SAB  0   IAB  0   Ectopic  0   Multiple  0   Live Births              Family History  Problem Relation Age of Onset  . Anemia Mother   . Autism Brother     Social History   Tobacco Use  . Smoking status: Never Smoker  . Smokeless tobacco: Never Used  Vaping Use  . Vaping Use: Never used  Substance Use Topics  . Alcohol use: No  . Drug use: No    Home Medications Prior to Admission medications   Not on File    Allergies    Patient has no known allergies.  Review of Systems   Review of Systems  Constitutional: Negative for fever.  Eyes: Negative for blurred vision.  Respiratory: Positive for cough.   Gastrointestinal: Positive for nausea and vomiting.  Genitourinary:       Heavy menstrual cycle  Neurological: Positive for headaches. Negative for focal weakness and weakness.  All other systems reviewed and are negative.   Physical Exam Updated Vital Signs BP 109/75 (BP  Location: Right Arm)   Pulse (!) 105   Temp 98 F (36.7 C) (Oral)   Resp 17   Ht 1.575 m (5\' 2" )   Wt 84.4 kg   SpO2 100%   BMI 34.02 kg/m   Physical Exam CONSTITUTIONAL: Well developed/well nourished HEAD: Normocephalic/atraumatic EYES: EOMI/PERRL, no nystagmus, no ptosis, conjunctiva pink ENMT: Mucous membranes moist NECK: supple no meningeal signs, no bruits SPINE/BACK:entire spine nontender CV: S1/S2 noted, no murmurs/rubs/gallops noted LUNGS: Lungs are clear to auscultation bilaterally, no apparent distress ABDOMEN: soft, nontender, no rebound or guarding GU:no cva tenderness NEURO:Awake/alert, face symmetric, no arm or leg drift is noted Equal 5/5 strength with shoulder  abduction, elbow flex/extension, wrist flex/extension in upper extremities and equal hand grips bilaterally Equal 5/5 strength with hip flexion,knee flex/extension, foot dorsi/plantar flexion Cranial nerves 3/4/5/6/10/15/08/11/12 tested and intact Gait normal without ataxia No past pointing Sensation to light touch intact in all extremities EXTREMITIES: pulses normal, full ROM SKIN: warm, color normal PSYCH: no abnormalities of mood noted, alert and oriented to situation   ED Results / Procedures / Treatments   Labs (all labs ordered are listed, but only abnormal results are displayed) Labs Reviewed  RESP PANEL BY RT-PCR (FLU A&B, COVID) ARPGX2 - Abnormal; Notable for the following components:      Result Value   SARS Coronavirus 2 by RT PCR POSITIVE (*)    All other components within normal limits  BASIC METABOLIC PANEL - Abnormal; Notable for the following components:   Glucose, Bld 102 (*)    Calcium 8.8 (*)    All other components within normal limits  CBC WITH DIFFERENTIAL/PLATELET - Abnormal; Notable for the following components:   Hemoglobin 8.3 (*)    HCT 28.4 (*)    MCV 71.0 (*)    MCH 20.8 (*)    MCHC 29.2 (*)    RDW 18.9 (*)    All other components within normal limits  I-STAT BETA HCG BLOOD, ED ([facility], [facility], [facility] ONLY)    EKG None  Radiology No results found.  Procedures Procedures   Medications Ordered in ED Medications  metoCLOPramide (REGLAN) injection 10 mg (10 mg Intravenous Given 04/09/20 0309)  diphenhydrAMINE (BENADRYL) injection 25 mg (25 mg Intravenous Given 04/09/20 0308)    ED Course  I have reviewed the triage vital signs and the nursing notes.  Pertinent labs  results that were available during my care of the patient were reviewed by me and considered in my medical decision making (see chart for details).    MDM Rules/Calculators/A&P                          2:47 AM Patient presents with headache.  She has no focal neuro deficit at this time. At  her request, we will check for COVID-19.  She also reports history of heavy vaginal bleeding in the setting of von Willebrand disease, will check labs including complete blood count to evaluate for anemia  4:27 AM Patient is improved.  No acute distress.  Anemia is actually improved from prior.  However she is positive for COVID-19.  Patient has had recent coughing.  She is otherwise in no distress, there is no hypoxia.  She is appropriate for outpatient management. We discussed return precautions.  We also discussed isolation guidelines.  BRAELEE HERRLE was evaluated in Emergency Department on 04/09/2020 for the symptoms described in the history of present illness. She was evaluated in the context of  the global COVID-19 pandemic, which necessitated consideration that the patient might be at risk for infection with the SARS-CoV-2 virus that causes COVID-19. Institutional protocols and algorithms that pertain to the evaluation of patients at risk for COVID-19 are in a state of rapid change based on information released by regulatory bodies including the CDC and federal and state organizations. These policies and algorithms were followed during the patient's care in the ED.  Final Clinical Impression(s) / ED Diagnoses Final diagnoses:  Other headache syndrome  COVID-19    Rx / DC Orders ED Discharge Orders    None       Ripley Fraise, MD 04/09/20 [phone]

## 2020-05-25 ENCOUNTER — Encounter: Payer: Self-pay | Admitting: Emergency Medicine

## 2020-05-25 ENCOUNTER — Other Ambulatory Visit: Payer: Self-pay

## 2020-05-25 ENCOUNTER — Ambulatory Visit
Admission: EM | Admit: 2020-05-25 | Discharge: 2020-05-25 | Disposition: A | Discharge status: Home / Self Care | Payer: Medicaid Other | Attending: Emergency Medicine | Admitting: Emergency Medicine

## 2020-05-25 DIAGNOSIS — R509 Fever, unspecified: Secondary | ICD-10-CM

## 2020-05-25 DIAGNOSIS — J020 Streptococcal pharyngitis: Secondary | ICD-10-CM

## 2020-05-25 DIAGNOSIS — Z20822 Contact with and (suspected) exposure to covid-19: Secondary | ICD-10-CM | POA: Diagnosis not present

## 2020-05-25 DIAGNOSIS — J029 Acute pharyngitis, unspecified: Secondary | ICD-10-CM | POA: Diagnosis not present

## 2020-05-25 LAB — POCT RAPID STREP A (OFFICE): Rapid Strep A Screen: POSITIVE — AB

## 2020-05-25 MED ORDER — LIDOCAINE VISCOUS HCL 2 % MT SOLN: 15.0000 mL | OROMUCOSAL | 0 refills | Status: AC | PRN

## 2020-05-25 MED ORDER — AMOXICILLIN-POT CLAVULANATE 875-125 MG PO TABS: 1.0000 | ORAL_TABLET | Freq: Two times a day (BID) | ORAL | 0 refills | Status: DC

## 2020-05-25 NOTE — ED Triage Notes (Signed)
States tonsils are swollen since Sunday and it hurts to talk.  Right ear pops once in a while.

## 2020-05-25 NOTE — ED Provider Notes (Signed)
[facility]   [id number] 05/25/20 Arrival Time: 0904   CC: Swollen tonsils  SUBJECTIVE: History from: patient.  Cynthia Acosta is a 20 y.o. female who presented to the urgent care for complaint of sore throat for the past 3 to 4 days.  Denies sick exposure to COVID, flu or strep.  Denies recent travel.  Has tried OTC medication without relief. Symptoms are made worse with eating. Denies previous symptoms in the past.   Denies fatigue, sinus pain, rhinorrhea, SOB, wheezing, chest pain, nausea, changes in bowel or bladder habits.     ROS: As per HPI.  All other pertinent ROS negative.      Past Medical History:  Diagnosis Date  . Anemia   . Contraceptive education 11/05/2013  . Hemorrhoid 11/05/2013  . History of rectal bleeding 11/05/2013  . History of von Willebrand's disease 11/05/2013  . Iron deficiency anemia, unspecified 12/25/2019  . Von Willebrand disease (HCC)    History reviewed. No pertinent surgical history. No Known Allergies No current facility-administered medications on file prior to encounter.   No current outpatient medications on file prior to encounter.   Social History   Socioeconomic History  . Marital status: Single    Spouse name: Not on file  . Number of children: Not on file  . Years of education: Not on file  . Highest education level: Not on file  Occupational History  . Not on file  Tobacco Use  . Smoking status: Never Smoker  . Smokeless tobacco: Never Used  Vaping Use  . Vaping Use: Never used  Substance and Sexual Activity  . Alcohol use: No  . Drug use: No  . Sexual activity: Yes    Birth control/protection: None  Other Topics Concern  . Not on file  Social History Narrative  . Not on file   Social Determinants of Health   Financial Resource Strain: Not on file  Food Insecurity: Not on file  Transportation Needs: Not on file  Physical Activity: Not on file  Stress: Not on file  Social Connections: Not on file   Intimate Partner Violence: Not on file   Family History  Problem Relation Age of Onset  . Anemia Mother   . Autism Brother     OBJECTIVE:  Vitals:   05/25/20 0928  BP: 113/68  Pulse: (!) 115  Resp: 18  Temp: 99.3 F (37.4 C)  TempSrc: Oral  SpO2: 98%     General appearance: alert; appears fatigued, but nontoxic; speaking in full sentences and tolerating own secretions HEENT: NCAT; Ears: EACs clear, TMs pearly gray; Eyes: PERRL.  EOM grossly intact. Sinuses: nontender; Nose: nares patent without rhinorrhea, Throat: oropharynx clear, tonsils  erythematous and enlarged with white tonsillar exudate, uvula midline  Neck: supple without LAD Lungs: unlabored respirations, symmetrical air entry; cough: absent; no respiratory distress; CTAB Heart: regular rate and rhythm.  Radial pulses 2+ symmetrical bilaterally Skin: warm and dry Psychological: alert and cooperative; normal mood and affect  LABS:  Results for orders placed or performed during the hospital encounter of 05/25/20 (from the past 24 hour(s))  POCT rapid strep A     Status: Abnormal   Collection Time: 05/25/20  9:55 AM  Result Value Ref Range   Rapid Strep A Screen Positive (A) Negative     ASSESSMENT & PLAN:  1. Sore throat   2. Fever, unknown origin   3. Strep pharyngitis     Meds ordered this encounter  Medications  .  amoxicillin-clavulanate (AUGMENTIN) 875-125 MG tablet    Sig: Take 1 tablet by mouth every 12 (twelve) hours.    Dispense:  14 tablet    Refill:  0  . lidocaine (XYLOCAINE) 2 % solution    Sig: Use as directed 15 mLs in the mouth or throat as needed for mouth pain.    Dispense:  100 mL    Refill:  0     Discharge Instructions  Get plenty of rest and push fluids Lidocaine mouthwash was prescribed Augmentin was prescribed for possible tonsillitis Gargle with salty warm water daily Change your toothbrush 2 days after starting the antibiotic Use medications daily for symptom  relief Use OTC medications like ibuprofen or tylenol as needed fever or pain Call or go to the ED if you have any new or worsening symptoms such as fever, worsening cough, shortness of breath, chest tightness, chest pain, turning blue, changes in mental status, etc...   Reviewed expectations re: course of current medical issues. Questions answered. Outlined signs and symptoms indicating need for more acute intervention. Patient verbalized understanding. After Visit Summary given.         Durward Parcel, FNP 05/25/20 1009

## 2020-05-25 NOTE — Discharge Instructions (Addendum)
Get plenty of rest and push fluids Lidocaine mouthwash was prescribed Augmentin was prescribed for possible tonsillitis Gargle with salty warm water daily Change your toothbrush 2 days after starting the antibiotic Use medications daily for symptom relief Use OTC medications like ibuprofen or tylenol as needed fever or pain Call or go to the ED if you have any new or worsening symptoms such as fever, worsening cough, shortness of breath, chest tightness, chest pain, turning blue, changes in mental status, etc..Marland Kitchen

## 2020-06-07 ENCOUNTER — Ambulatory Visit: Payer: Medicaid Other | Admitting: Adult Health

## 2020-06-20 ENCOUNTER — Other Ambulatory Visit: Payer: Medicaid Other

## 2020-06-28 ENCOUNTER — Ambulatory Visit
Admission: EM | Admit: 2020-06-28 | Discharge: 2020-06-28 | Disposition: A | Discharge status: Home / Self Care | Payer: Medicaid Other | Attending: Physician Assistant | Admitting: Physician Assistant

## 2020-06-28 ENCOUNTER — Other Ambulatory Visit: Payer: Self-pay

## 2020-06-28 DIAGNOSIS — J029 Acute pharyngitis, unspecified: Secondary | ICD-10-CM | POA: Insufficient documentation

## 2020-06-28 LAB — POCT RAPID STREP A (OFFICE): Rapid Strep A Screen: NEGATIVE

## 2020-06-28 NOTE — ED Triage Notes (Signed)
Pt presents with c/o sore throat and nasal congestion  For past couple of days

## 2020-06-28 NOTE — Discharge Instructions (Addendum)
Tylenol or ibuprofen every 4 hours. Your covid is pending

## 2020-06-29 LAB — SARS-COV-2, NAA 2 DAY TAT

## 2020-06-29 LAB — NOVEL CORONAVIRUS, NAA: SARS-CoV-2, NAA: NOT DETECTED

## 2020-06-30 NOTE — ED Provider Notes (Signed)
[facility]    CSN: [id number] Arrival date & time: 06/28/20  1824      History   Chief Complaint Chief Complaint  Patient presents with  . Sore Throat    HPI Cynthia Acosta is a 20 y.o. female.   The history is provided by the patient. No language interpreter was used.  Sore Throat This is a new problem. The current episode started less than 1 hour ago. The problem occurs constantly. The problem has not changed since onset.Pertinent negatives include no headaches. Nothing aggravates the symptoms. Nothing relieves the symptoms. She has tried nothing for the symptoms. The treatment provided no relief.    Past Medical History:  Diagnosis Date  . Anemia   . Contraceptive education 11/05/2013  . Hemorrhoid 11/05/2013  . History of rectal bleeding 11/05/2013  . History of von Willebrand's disease 11/05/2013  . Iron deficiency anemia, unspecified 12/25/2019  . Von Willebrand disease [facility])     Patient Active Problem List   Diagnosis Date Noted  . Iron deficiency anemia, unspecified 12/25/2019  . Menorrhagia with regular cycle 12/22/2019  . Screening examination for STD (sexually transmitted disease) 12/22/2019  . Irregular bleeding 02/11/2019  . Urine pregnancy test negative 02/11/2019  . Hemorrhoid 11/05/2013  . History of rectal bleeding 11/05/2013  . Contraceptive education 11/05/2013  . History of von Willebrand's disease 11/05/2013  . Urinary incontinence 03/12/2013  . Urinary urgency 03/12/2013  . Urinary frequency 03/12/2013  . Fullness of neck 03/12/2013    History reviewed. No pertinent surgical history.  OB History    Gravida  0   Para  0   Term  0   Preterm  0   AB  0   Living  0     SAB  0   IAB  0   Ectopic  0   Multiple  0   Live Births               Home Medications    Prior to Admission medications   Medication Sig Start Date End Date Taking? Authorizing Provider  lidocaine (XYLOCAINE) 2 % solution Use as  directed 15 mLs in the mouth or throat as needed for mouth pain. 05/25/20   AvegnoZachery Dakins, FNP    Family History Family History  Problem Relation Age of Onset  . Anemia Mother   . Autism Brother     Social History Social History   Tobacco Use  . Smoking status: Never Smoker  . Smokeless tobacco: Never Used  Vaping Use  . Vaping Use: Never used  Substance Use Topics  . Alcohol use: No  . Drug use: No     Allergies   Patient has no known allergies.   Review of Systems Review of Systems  Neurological: Negative for headaches.  All other systems reviewed and are negative.    Physical Exam Triage Vital Signs ED Triage Vitals  Enc Vitals Group     BP 06/28/20 1853 112/74     Pulse Rate 06/28/20 1853 91     Resp 06/28/20 1853 18     Temp 06/28/20 1853 99.3 F (37.4 C)     Temp Source 06/28/20 1853 Oral     SpO2 06/28/20 1853 98 %     Weight --      Height --      Head Circumference --      Peak Flow --      Pain Score 06/28/20 1858 5  Pain Loc --      Pain Edu? --      Excl. in GC? --    No data found.  Updated Vital Signs BP 112/74   Pulse 91   Temp 99.3 F (37.4 C) (Oral)   Resp 18   LMP  (LMP Unknown)   SpO2 98%   Visual Acuity Right Eye Distance:   Left Eye Distance:   Bilateral Distance:    Right Eye Near:   Left Eye Near:    Bilateral Near:     Physical Exam Vitals and nursing note reviewed.  Constitutional:      Appearance: She is well-developed.  HENT:     Head: Normocephalic.     Mouth/Throat:     Pharynx: Posterior oropharyngeal erythema present.  Pulmonary:     Effort: Pulmonary effort is normal.  Abdominal:     General: There is no distension.  Musculoskeletal:        General: Normal range of motion.     Cervical back: Normal range of motion.  Skin:    General: Skin is warm.  Neurological:     Mental Status: She is alert and oriented to person, place, and time.  Psychiatric:        Mood and Affect: Mood normal.       UC Treatments / Results  Labs (all labs ordered are listed, but only abnormal results are displayed) Labs Reviewed  CULTURE, GROUP A STREP [facility])  NOVEL CORONAVIRUS, NAA   Narrative:    Performed at:  [address], [place], [place]  [id number] Lab Director: Jolene Schimke MD, Phone:  [phone]  SARS-COV-2, NAA 2 DAY TAT   Narrative:    Performed at:  [address] [place] [address], [place], [place]  [id number] Lab Director: Jolene Schimke MD, Phone:  [phone]  POCT RAPID STREP A (OFFICE)    EKG   Radiology No results found.  Procedures Procedures (including critical care time)  Medications Ordered in UC Medications - No data to display  Initial Impression / Assessment and Plan / UC Course  I have reviewed the triage vital signs and the nursing notes.  Pertinent labs & imaging results that were available during my care of the patient were reviewed by me and considered in my medical decision making (see chart for details).     Strep negative,  I advised tylenol or advil  Symptomatic care.  Final Clinical Impressions(s) / UC Diagnoses   Final diagnoses:  Acute pharyngitis, unspecified etiology     Discharge Instructions     Tylenol or ibuprofen every 4 hours. Your covid is pending    ED Prescriptions    None     PDMP not reviewed this encounter.  An After Visit Summary was printed and given to the patient.    Elson Areas, [place] 06/30/20 1736

## 2020-07-01 LAB — CULTURE, GROUP A STREP (THRC)

## 2020-09-15 ENCOUNTER — Other Ambulatory Visit: Payer: Medicaid Other | Admitting: Adult Health
